# Patient Record
Sex: Female | Born: 2000 | Race: Black or African American | Hispanic: No | Marital: Single | Smoking: Never smoker
Health system: Southern US, Community
[De-identification: ages and names within clinical notes are randomized; demographics above are authoritative.]

## PROBLEM LIST (undated history)

## (undated) DIAGNOSIS — A749 Chlamydial infection, unspecified: Principal | ICD-10-CM

## (undated) DIAGNOSIS — F329 Major depressive disorder, single episode, unspecified: Secondary | ICD-10-CM

## (undated) DIAGNOSIS — F909 Attention-deficit hyperactivity disorder, unspecified type: Secondary | ICD-10-CM

## (undated) DIAGNOSIS — Z8619 Personal history of other infectious and parasitic diseases: Secondary | ICD-10-CM

## (undated) DIAGNOSIS — J45909 Unspecified asthma, uncomplicated: Secondary | ICD-10-CM

## (undated) DIAGNOSIS — F32A Depression, unspecified: Secondary | ICD-10-CM

## (undated) DIAGNOSIS — J302 Other seasonal allergic rhinitis: Secondary | ICD-10-CM

## (undated) HISTORY — DX: Personal history of other infectious and parasitic diseases: Z86.19

## (undated) HISTORY — PX: TONSILLECTOMY AND ADENOIDECTOMY: SUR1326

## (undated) HISTORY — DX: Chlamydial infection, unspecified: A74.9

---
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
## 2018-02-16 DIAGNOSIS — R0789 Other chest pain: Secondary | ICD-10-CM | POA: Diagnosis not present

## 2018-02-17 DIAGNOSIS — R0789 Other chest pain: Secondary | ICD-10-CM | POA: Diagnosis not present

## 2018-02-18 ENCOUNTER — Telehealth: Payer: Self-pay | Admitting: *Deleted

## 2018-02-18 NOTE — Telephone Encounter (Signed)
Pt's mother phoned to state pt was in ICU at [facility], D/C 02/17/18. A medication was prescribed that was not covered by insurance and she wanted to know if it could be changed. Spoke with pharmacy ([facility] in [place]) and they stated it was levaquin and had already been changed. Notified mom.
